# Patient Record
Sex: Male | Born: 1960 | Race: Black or African American | Hispanic: No | Marital: Married | Smoking: Never smoker
Health system: Southern US, Community
[De-identification: ages and names within clinical notes are randomized; demographics above are authoritative.]

## PROBLEM LIST (undated history)

## (undated) DIAGNOSIS — F32A Depression, unspecified: Secondary | ICD-10-CM

---

## 2012-11-01 ENCOUNTER — Telehealth: Payer: Self-pay | Admitting: Hematology & Oncology

## 2012-11-01 NOTE — Telephone Encounter (Signed)
Left pt message to call and schedule appointment °

## 2012-11-04 ENCOUNTER — Telehealth: Payer: Self-pay | Admitting: Hematology & Oncology

## 2012-11-04 NOTE — Telephone Encounter (Signed)
Left pt message to call and schedule appointment °

## 2012-11-05 ENCOUNTER — Telehealth: Payer: Self-pay | Admitting: Hematology & Oncology

## 2012-11-05 NOTE — Telephone Encounter (Signed)
Pt aware of 6-5 appointment. °

## 2012-12-12 ENCOUNTER — Other Ambulatory Visit: Payer: Self-pay | Admitting: Lab

## 2012-12-12 ENCOUNTER — Ambulatory Visit: Payer: Self-pay | Admitting: Hematology & Oncology

## 2012-12-12 ENCOUNTER — Encounter: Payer: Self-pay | Admitting: Hematology & Oncology

## 2012-12-12 ENCOUNTER — Telehealth: Payer: Self-pay | Admitting: Hematology & Oncology

## 2012-12-12 ENCOUNTER — Ambulatory Visit: Payer: Self-pay

## 2012-12-12 NOTE — Telephone Encounter (Signed)
Called referring left Alcario Drought message [phone] that pt was a no show for appointment today.

## 2013-04-07 NOTE — Progress Notes (Signed)
This encounter was created in error - please disregard.

## 2019-10-20 ENCOUNTER — Ambulatory Visit: Payer: Medicaid Other | Attending: Internal Medicine

## 2019-10-20 DIAGNOSIS — Z23 Encounter for immunization: Secondary | ICD-10-CM

## 2019-10-20 NOTE — Progress Notes (Signed)
   Covid-19 Vaccination Clinic  Name:  Kristopher Morrow    MRN: [id number] DOB: 02-Jan-1961  10/20/2019  Mr. [name] was observed post Covid-19 immunization for 15 minutes without incident. He was provided with Vaccine Information Sheet and instruction to access the V-Safe system.   Mr. [name] was instructed to call 911 with any severe reactions post vaccine: Marland Kitchen Difficulty breathing  . Swelling of face and throat  . A fast heartbeat  . A bad rash all over body  . Dizziness and weakness   Immunizations Administered    Name Date Dose VIS Date Route   Pfizer COVID-19 Vaccine 10/20/2019  1:12 PM 0.3 mL 06/20/2019 Intramuscular   Manufacturer: [employer], [employer]   Lot: [id number]   NDC: [address]-4

## 2019-11-10 ENCOUNTER — Ambulatory Visit: Payer: Medicaid Other | Attending: Internal Medicine

## 2019-11-10 DIAGNOSIS — Z23 Encounter for immunization: Secondary | ICD-10-CM

## 2019-11-10 NOTE — Progress Notes (Signed)
   Covid-19 Vaccination Clinic  Name:  Kristopher Morrow    MRN: [id number] DOB: 01/17/1961  11/10/2019  Mr. Sarin was observed post Covid-19 immunization for 15 minutes without incident. He was provided with Vaccine Information Sheet and instruction to access the V-Safe system.   Mr. Peral was instructed to call 911 with any severe reactions post vaccine: Marland Kitchen Difficulty breathing  . Swelling of face and throat  . A fast heartbeat  . A bad rash all over body  . Dizziness and weakness   Immunizations Administered    Name Date Dose VIS Date Route   Pfizer COVID-19 Vaccine 11/10/2019  2:04 PM 0.3 mL 09/03/2018 Intramuscular   Manufacturer: [employer], [employer]   Lot: [id number]   NDC: [address]-4

## 2020-11-27 ENCOUNTER — Emergency Department (HOSPITAL_COMMUNITY): Payer: Medicare Other

## 2020-11-27 ENCOUNTER — Emergency Department (HOSPITAL_COMMUNITY)
Admission: EM | Admit: 2020-11-27 | Discharge: 2020-11-27 | Disposition: A | Discharge status: Home / Self Care | Payer: Medicare Other | Attending: Emergency Medicine | Admitting: Emergency Medicine

## 2020-11-27 ENCOUNTER — Other Ambulatory Visit: Payer: Self-pay

## 2020-11-27 ENCOUNTER — Encounter (HOSPITAL_COMMUNITY): Payer: Self-pay

## 2020-11-27 DIAGNOSIS — S0993XA Unspecified injury of face, initial encounter: Secondary | ICD-10-CM | POA: Diagnosis present

## 2020-11-27 DIAGNOSIS — S01112A Laceration without foreign body of left eyelid and periocular area, initial encounter: Secondary | ICD-10-CM | POA: Diagnosis not present

## 2020-11-27 DIAGNOSIS — S0292XA Unspecified fracture of facial bones, initial encounter for closed fracture: Secondary | ICD-10-CM | POA: Insufficient documentation

## 2020-11-27 DIAGNOSIS — S0990XA Unspecified injury of head, initial encounter: Secondary | ICD-10-CM

## 2020-11-27 DIAGNOSIS — S0181XA Laceration without foreign body of other part of head, initial encounter: Secondary | ICD-10-CM

## 2020-11-27 DIAGNOSIS — Z23 Encounter for immunization: Secondary | ICD-10-CM | POA: Insufficient documentation

## 2020-11-27 DIAGNOSIS — R519 Headache, unspecified: Secondary | ICD-10-CM | POA: Diagnosis not present

## 2020-11-27 MED ORDER — TETANUS-DIPHTH-ACELL PERTUSSIS 5-2.5-18.5 LF-MCG/0.5 IM SUSY
0.5000 mL | PREFILLED_SYRINGE | Freq: Once | INTRAMUSCULAR | Status: AC
  Administered 2020-11-27: 0.5 mL via INTRAMUSCULAR
  Filled 2020-11-27: qty 0.5

## 2020-11-27 MED ORDER — HYDROCODONE-ACETAMINOPHEN 5-325 MG PO TABS: 1.0000 | ORAL_TABLET | Freq: Four times a day (QID) | ORAL | 0 refills | Status: AC | PRN

## 2020-11-27 MED ORDER — CEPHALEXIN 500 MG PO CAPS: 500.0000 mg | ORAL_CAPSULE | Freq: Four times a day (QID) | ORAL | 0 refills | Status: AC

## 2020-11-27 MED ORDER — PREDNISONE 10 MG PO TABS: 20.0000 mg | ORAL_TABLET | Freq: Two times a day (BID) | ORAL | 0 refills | Status: AC

## 2020-11-27 MED ORDER — LIDOCAINE-EPINEPHRINE 1 %-1:100000 IJ SOLN
20.0000 mL | Freq: Once | INTRAMUSCULAR | Status: AC
  Administered 2020-11-27: 20 mL
  Filled 2020-11-27: qty 1

## 2020-11-27 NOTE — ED Notes (Signed)
Not in room at this time 

## 2020-11-27 NOTE — ED Notes (Signed)
Provider at bedside at this time
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
## 2020-11-27 NOTE — Discharge Instructions (Addendum)
Local wound care with bacitracin twice daily.  You are to follow-up with Dr. Ross Marcus this coming Wednesday.  His contact information has been provided in this discharge summary for you to call and make these arrangements.  Ice for 20 minutes every 2 hours while awake for the next 2 days.  Begin taking prednisone and Keflex as prescribed, and to begin taking hydrocodone as prescribed as needed for pain.
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
## 2020-11-27 NOTE — ED Notes (Signed)
Pt faced cleaned. 4x4 with tape applied to lt eye. Pt made aware of d/c status. Pt previously stated that he was going to transportation home and that he lived in [place]. Spoke with charge to verify we don't arrange transport for that distance. Pt made aware of this at this time. Per pt he has no one that he can call to pick him up. Apologized for [place] not being able to arrange for transport. Pt then asked if ambulance could take him home. Made him aware that this was not available for him either. Pt stated that EMS advised him that we would make arrangements for him home. Apologized to pt and informed him that he shouldn't have been told this. Informed pt that I would be getting his paperwork together to get him d/c

## 2020-12-07 ENCOUNTER — Emergency Department (HOSPITAL_COMMUNITY)
Admission: EM | Admit: 2020-12-07 | Discharge: 2020-12-07 | Disposition: A | Discharge status: Home / Self Care | Payer: Medicare Other | Attending: Medical | Admitting: Medical

## 2020-12-07 ENCOUNTER — Other Ambulatory Visit: Payer: Self-pay

## 2020-12-07 ENCOUNTER — Encounter (HOSPITAL_COMMUNITY): Payer: Self-pay | Admitting: Emergency Medicine

## 2020-12-07 DIAGNOSIS — S01112D Laceration without foreign body of left eyelid and periocular area, subsequent encounter: Secondary | ICD-10-CM | POA: Insufficient documentation

## 2020-12-07 DIAGNOSIS — Z4800 Encounter for change or removal of nonsurgical wound dressing: Secondary | ICD-10-CM | POA: Diagnosis not present

## 2020-12-07 DIAGNOSIS — Z4802 Encounter for removal of sutures: Secondary | ICD-10-CM

## 2020-12-07 HISTORY — DX: Depression, unspecified: F32.A

## 2020-12-07 NOTE — ED Notes (Signed)
Pt discharged by provider in triage. Pt verbalizes understanding of d/c instructions. Pt ambulatory at d/c with all belongings.

## 2020-12-07 NOTE — ED Triage Notes (Signed)
Pt here for suture removal under L eye.  States sutures were placed on 5/21 and feels like they are "pulling".
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
## 2020-12-07 NOTE — Discharge Instructions (Addendum)
Call Dr. Joie Bimler office TODAY to schedule a follow up appointment as previously indicated

## 2021-11-19 IMAGING — CT CT MAXILLOFACIAL W/O CM
3 series · 14 of 47 positions shown, 16 images · non-contrast
Comparison: None.

CLINICAL DATA: Head and facial trauma

EXAM:
CT HEAD WITHOUT CONTRAST
CT MAXILLOFACIAL WITHOUT CONTRAST
TECHNIQUE: Multidetector CT imaging of the head and maxillofacial structures
were performed using the standard protocol without intravenous
contrast. Multiplanar CT image reconstructions of the maxillofacial
structures were also generated.

[Series 3: facialbone 2.0 st · axial · 0.34mm/px · z∈[+1181,+1345]mm · 8 of 96 slices shown, 10 images]
[im 7/96  brain]
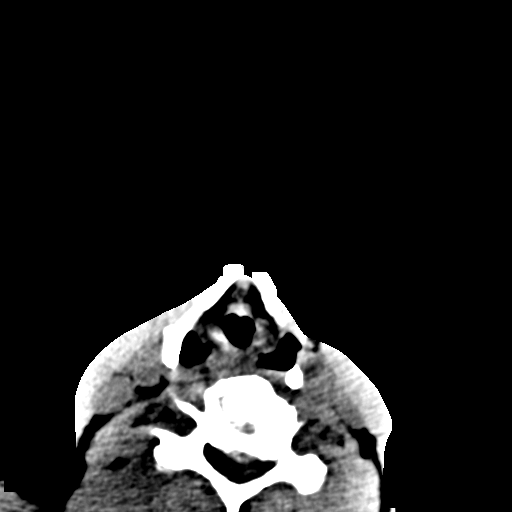
[im 7/96  bone]
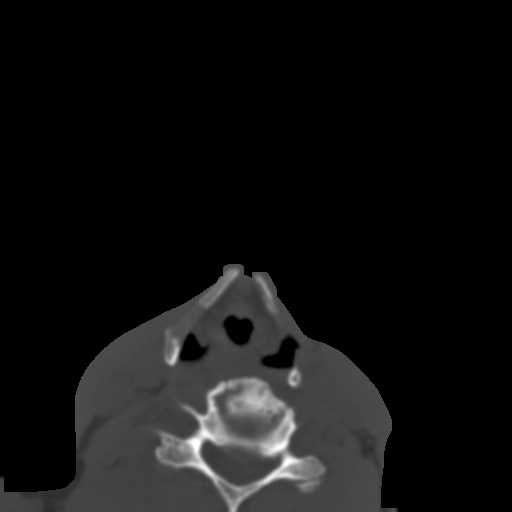
[im 20/96  bone]
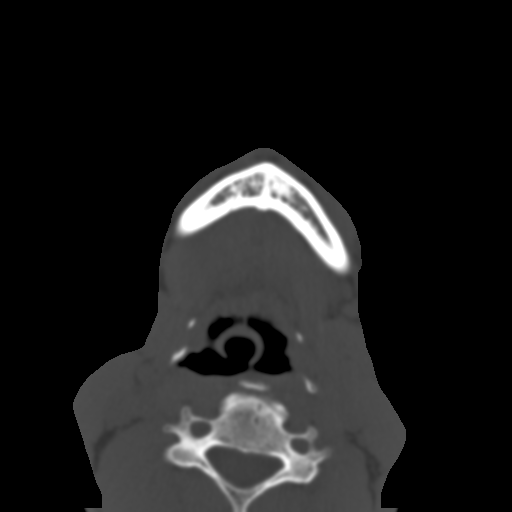
[im 30/96  bone]
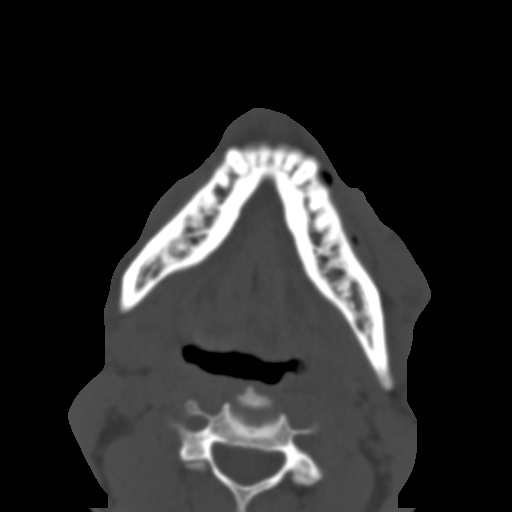
[im 43/96  bone]
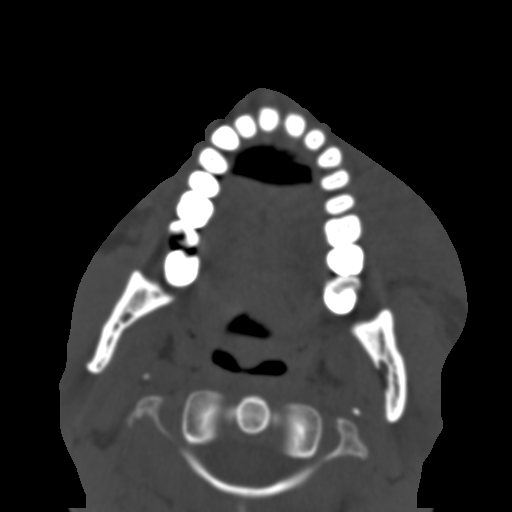
[im 53/96  brain]
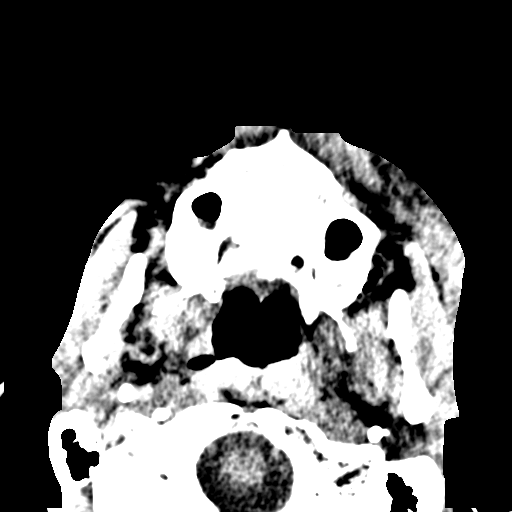
[im 53/96  bone]
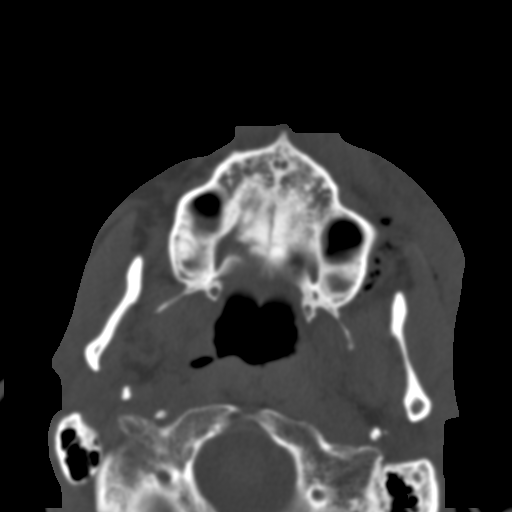
[im 66/96  bone]
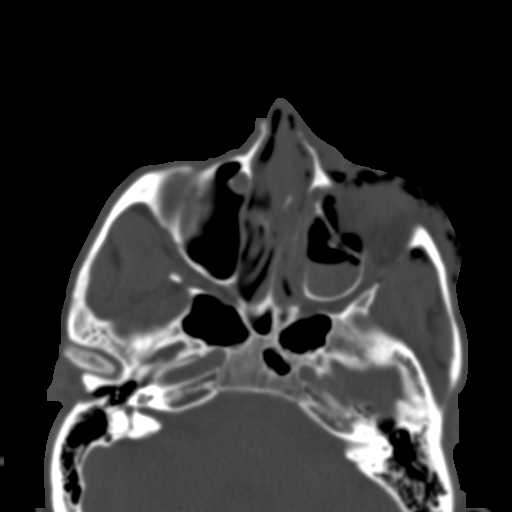
[im 76/96  bone]
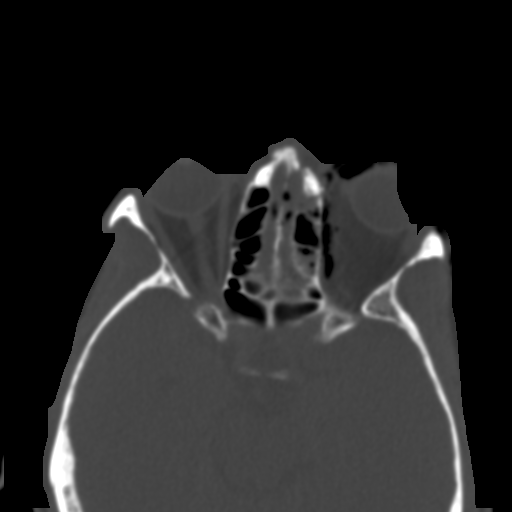
[im 89/96  bone]
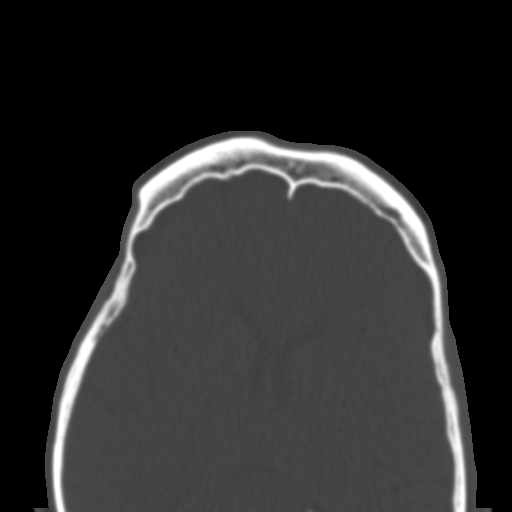

[Series 7: facialbone 2.0 cor st · coronal · 0.30mm/px · 3 of 78 slices shown]
[im 26/78  bone]
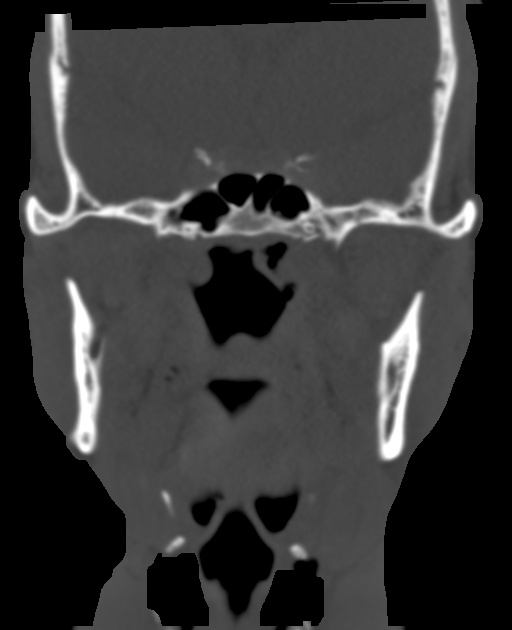
[im 35/78  bone]
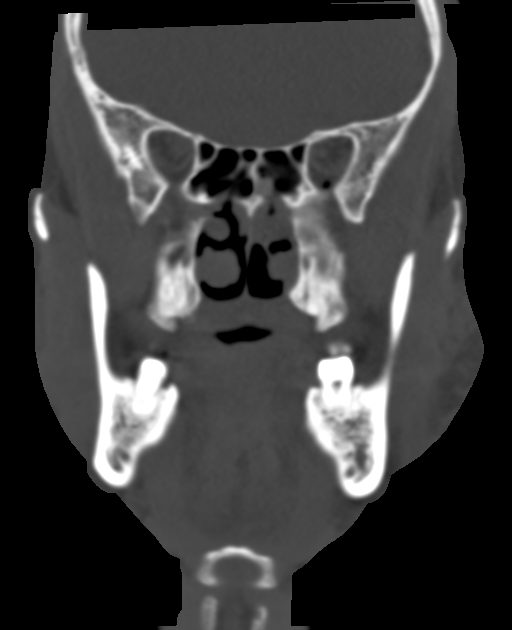
[im 43/78  bone]
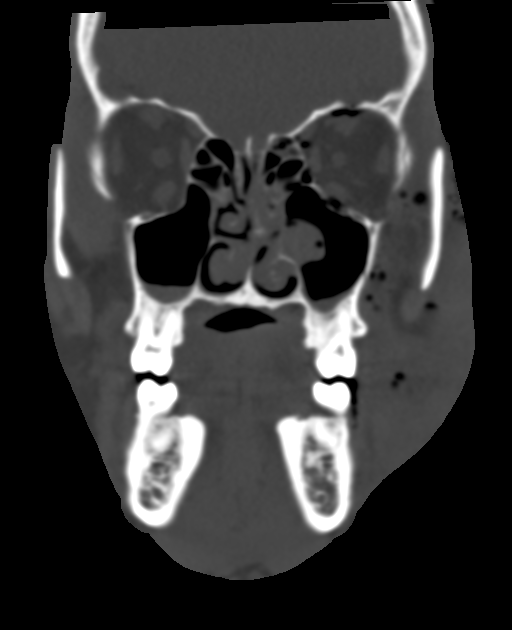

[Series 8: facialbone 2.0 sag st · sagittal · 0.28mm/px · 3 of 76 slices shown]
[im 26/76  bone]
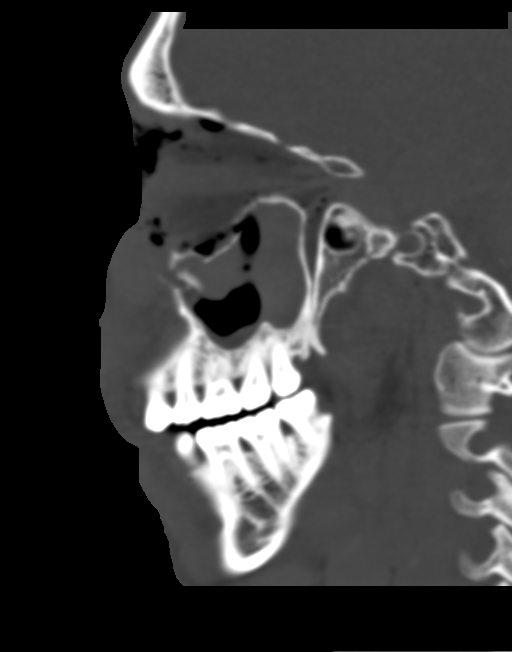
[im 38/76  bone]
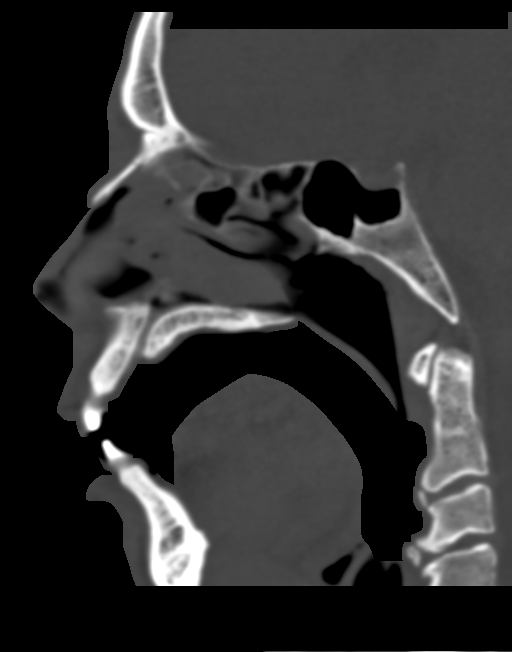
[im 51/76  bone]
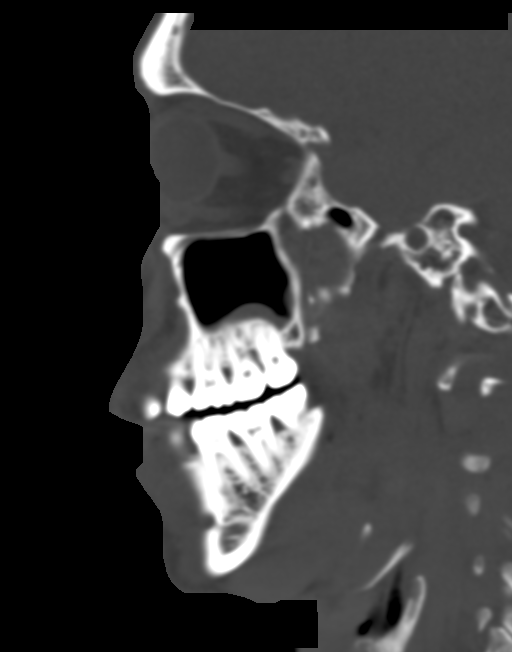

[14 of 47 positions shown; findings below may reference images not displayed]

FINDINGS: CT HEAD FINDINGS

Brain: There is no mass, hemorrhage or extra-axial collection. The
size and configuration of the ventricles and extra-axial CSF spaces
are normal. Old left occipital infarct.

Vascular: No hyperdense vessel or unexpected vascular calcification.

CT MAXILLOFACIAL FINDINGS

Osseous:

--Complex facial fracture types: Nasoorbitoethmoid (nasal bones,
frontal processes of the maxilla and medial orbital walls) and left
zygomaticomaxillary complex (zygomatic arch, lateral and anterior
maxillary walls and lateral orbital wall) fracture patterns.

--Simple fracture types: Additional fractures of the left orbital
floor, left superior orbital rim. The left orbital floor fracture
fragment is displaced by approximately 4 mm and traverses the
inferior orbital foramen.

--Mandible, hard palate and teeth: No acute abnormality.
Incidentally noted torus mandibularis.

Orbits: There is a large amount of left orbital extraconal gas and
periorbital gas with soft tissue swelling. Both globes appear
intact.

Sinuses: Left maxillary hemosinus. Blood also in the ethmoid
sinuses.

Soft tissues: Large amount of left periorbital subcutaneous
emphysema.
IMPRESSION: 1. No acute intracranial hemorrhage.
2. Nasoorbitoethmoid complex fracture pattern with suspected
involvement of the left frontal recess and nasolacrimal duct.
3. Left zygomaticomaxillary complex fracture pattern.
4. Minimally displaced fracture of the left superior orbital rim
with trace pneumocephalus (series 5, image 32). No visible
hemorrhage, but a follow-up head CT might be helpful to ensure there
is no evolving hematoma.
5. Left orbital floor fracture traverses the infraorbital foramen.
No evidence of extraocular muscle entrapment.
